# Patient Record
Sex: Male | Born: 1985 | Race: Black or African American | Hispanic: No | Marital: Single | Smoking: Current every day smoker
Health system: Southern US, Community
[De-identification: ages and names within clinical notes are randomized; demographics above are authoritative.]

---
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
## 2014-06-03 ENCOUNTER — Emergency Department (INDEPENDENT_AMBULATORY_CARE_PROVIDER_SITE_OTHER): Payer: BC Managed Care – PPO

## 2014-06-03 ENCOUNTER — Emergency Department (INDEPENDENT_AMBULATORY_CARE_PROVIDER_SITE_OTHER)
Admission: EM | Admit: 2014-06-03 | Discharge: 2014-06-03 | Disposition: A | Discharge status: Home / Self Care | Payer: BC Managed Care – PPO | Source: Home / Self Care | Attending: Family Medicine | Admitting: Family Medicine

## 2014-06-03 ENCOUNTER — Encounter (HOSPITAL_COMMUNITY): Payer: Self-pay | Admitting: Emergency Medicine

## 2014-06-03 DIAGNOSIS — M545 Low back pain, unspecified: Secondary | ICD-10-CM

## 2014-06-03 MED ORDER — IBUPROFEN 800 MG PO TABS
ORAL_TABLET | ORAL | Status: AC
  Filled 2014-06-03: qty 1

## 2014-06-03 MED ORDER — IBUPROFEN 800 MG PO TABS
800.0000 mg | ORAL_TABLET | Freq: Once | ORAL | Status: AC
  Administered 2014-06-03: 800 mg via ORAL

## 2014-06-03 MED ORDER — INDOMETHACIN 25 MG PO CAPS: 25.0000 mg | ORAL_CAPSULE | Freq: Three times a day (TID) | ORAL | Status: DC

## 2014-06-03 NOTE — ED Notes (Signed)
Low back pain and neck pain.  Onset 3 weeks ago, no known injury

## 2014-06-03 NOTE — ED Provider Notes (Signed)
CSN: [id number]     Arrival date & time 06/03/14  1713 History   First MD Initiated Contact with Patient 06/03/14 1754     Chief Complaint  Patient presents with  . Back Pain  . Neck Pain   (Consider location/radiation/quality/duration/timing/severity/associated sxs/prior Treatment) HPI Comments: No specific injury or surgery in past No previous episodes No meds taken at home Pain most noticeable when bending over or jumping or upon getting out bed in the mornings No changes in strength, sensation or coordination of bilateral lower extremities. No fevers or malaise. 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 male presenting with back pain and neck pain. The history is provided by the patient.  Back Pain Location:  Lumbar spine Quality:  Aching Radiates to:  Does not radiate Pain severity:  Moderate Worse during: Symptoms are most notable in the morning upon getting out of bed. Onset quality:  Gradual Duration:  3 weeks Timing:  Constant Progression:  Unchanged Chronicity:  New Context: not falling, not jumping from heights, not lifting heavy objects, not occupational injury, not pedestrian accident, not physical stress, not recent illness, not recent injury and not twisting   Neck Pain   History reviewed. No pertinent past medical history. History reviewed. No pertinent past surgical history. No family history on file. History  Substance Use Topics  . Smoking status: Current Every Day Smoker  . Smokeless tobacco: Not on file  . Alcohol Use: Yes    Review of Systems  Musculoskeletal: Positive for back pain and neck pain.  All other systems reviewed and are negative.   Allergies  Review of patient's allergies indicates no known allergies.  Home Medications   Prior to Admission medications   Medication Sig Start  Date End Date Taking? Authorizing Provider  indomethacin (INDOCIN) 25 MG capsule Take 1 capsule (25 mg total) by mouth 3 (three) times daily with meals. X 5 days 06/03/14   Jess BartersJennifer Lee Imer Foxworth, PA   BP 118/70  Pulse 79  Temp(Src) 98.6 F (37 C) (Oral)  Resp 16 Physical Exam  Nursing note and vitals reviewed. Constitutional: He is oriented to person, place, and time. He appears well-developed and well-nourished. No distress.  HENT:  Head: Normocephalic and atraumatic.  Eyes: Conjunctivae are normal. No scleral icterus.  Neck: Normal range of motion. Neck supple.  Cardiovascular: Normal rate, regular rhythm and normal heart sounds.   Pulmonary/Chest: Effort normal and breath sounds normal. No respiratory distress. He has no wheezes.  Abdominal: Soft. Bowel sounds are normal. He exhibits no distension. There is no tenderness.  Genitourinary:  Declines exam but denies saddle anesthesia   Musculoskeletal: Normal range of motion. He exhibits no edema.       Lumbar back: He exhibits tenderness, bony tenderness and pain. He exhibits normal range of motion, no swelling, no edema, no deformity, no laceration, no spasm and normal pulse.       Back:  Outlined area is area of tenderness  Neurological: He is alert and oriented to person, place, and time. He has normal strength. No sensory deficit. Coordination and gait normal.  Reflex Scores:      Patellar reflexes are 2+ on the right side and 2+ on the left side. CSM exam of bilateral lower extremities without deficit.   Skin: Skin is warm and dry. No rash noted. No erythema.  Psychiatric: He has a normal mood and affect. His behavior is normal.    ED Course  Procedures (including critical care time) Labs Review Labs Reviewed - No data to display  Imaging Review Dg Lumbar Spine Complete  06/03/2014   CLINICAL DATA:  Low back pain  EXAM: LUMBAR SPINE - COMPLETE 4+ VIEW  COMPARISON:  None.  FINDINGS: Normal alignment of lumbar vertebral  bodies. No loss of vertebral body height or disc height. No pars fracture. No subluxation.  IMPRESSION: Normal lumbar radiographs.   Electronically Signed   By: Genevive BiStewart  Edmunds M.D.   On: 06/03/2014 18:46     MDM   1. Midline low back pain without sciatica   LS spine films normal. Exam consistent with non-specific lumbago and without evidence of acute neurological deficit or cauda equina syndrome. Afebrile. Will treat with 5 day course of indocin [facility] if [facility] improvement.    Jess BartersJennifer Lee [place], [place] 06/03/14 1919

## 2014-06-03 NOTE — ED Provider Notes (Signed)
Medical screening examination/treatment/procedure(s) were performed by resident physician or non-physician practitioner and as supervising physician I was immediately available for consultation/collaboration.   Barkley BrunsKINDL,Itzamar Traynor DOUGLAS MD.   Linna HoffJames D Lossie Kalp, MD 06/03/14 [phone]

## 2014-06-03 NOTE — ED Notes (Signed)
Looked for visitor in lobby, unable to locate.  Patient notified.

## 2014-06-03 NOTE — Discharge Instructions (Signed)
Back Exercises Back exercises help treat and prevent back injuries. The goal of back exercises is to increase the strength of your abdominal and back muscles and the flexibility of your back. These exercises should be started when you no longer have back pain. Back exercises include:  Pelvic Tilt. Lie on your back with your knees bent. Tilt your pelvis until the lower part of your back is against the floor. Hold this position 5 to 10 sec and repeat 5 to 10 times.  Knee to Chest. Pull first 1 knee up against your chest and hold for 20 to 30 seconds, repeat this with the other knee, and then both knees. This may be done with the other leg straight or bent, whichever feels better.  Sit-Ups or Curl-Ups. Bend your knees 90 degrees. Start with tilting your pelvis, and do a partial, slow sit-up, lifting your trunk only 30 to 45 degrees off the floor. Take at least 2 to 3 seconds for each sit-up. Do not do sit-ups with your knees out straight. If partial sit-ups are difficult, simply do the above but with only tightening your abdominal muscles and holding it as directed.  Hip-Lift. Lie on your back with your knees flexed 90 degrees. Push down with your feet and shoulders as you raise your hips a couple inches off the floor; hold for 10 seconds, repeat 5 to 10 times.  Back arches. Lie on your stomach, propping yourself up on bent elbows. Slowly press on your hands, causing an arch in your low back. Repeat 3 to 5 times. Any initial stiffness and discomfort should lessen with repetition over time.  Shoulder-Lifts. Lie face down with arms beside your body. Keep hips and torso pressed to floor as you slowly lift your head and shoulders off the floor. Do not overdo your exercises, especially in the beginning. Exercises may cause you some mild back discomfort which lasts for a few minutes; however, if the pain is more severe, or lasts for more than 15 minutes, do not continue exercises until you see your caregiver.  Improvement with exercise therapy for back problems is slow.  See your caregivers for assistance with developing a proper back exercise program. Document Released: 12/21/2004 Document Revised: 02/05/2012 Document Reviewed: 09/14/2011 [facility] Patient Information 2015 [place], [place]. This information is not intended to replace advice given to you by your health care provider. Make sure you discuss any questions you have with your health care provider.  Back Injury Prevention Back injuries can be extremely painful and difficult to heal. After having one back injury, you are much more likely to experience another later on. It is important to learn how to avoid injuring or re-injuring your back. The following tips can help you to prevent a back injury. PHYSICAL FITNESS  Exercise regularly and try to develop good tone in your abdominal muscles. Your abdominal muscles provide a lot of the support needed by your back.  Do aerobic exercises (walking, jogging, biking, swimming) regularly.  Do exercises that increase balance and strength (tai chi, yoga) regularly. This can decrease your risk of falling and injuring your back.  Stretch before and after exercising.  Maintain a healthy weight. The more you weigh, the more stress is placed on your back. For every pound of weight, 10 times that amount of pressure is placed on the back. DIET  Talk to your caregiver about how much calcium and vitamin D you need per day. These nutrients help to prevent weakening of the bones (osteoporosis). Osteoporosis  can cause broken (fractured) bones that lead to back pain. °· Include good sources of calcium in your diet, such as dairy products, green, leafy vegetables, and products with calcium added (fortified). °· Include good sources of vitamin D in your diet, such as milk and foods that are fortified with vitamin D. °· Consider taking a nutritional supplement or a multivitamin if needed. °· Stop smoking if you  smoke. °POSTURE °· Sit and stand up straight. Avoid leaning forward when you sit or hunching over when you stand. °· Choose chairs with good low back (lumbar) support. °· If you work at a desk, sit close to your work so you do not need to lean over. Keep your chin tucked in. Keep your neck drawn back and elbows bent at a right angle. Your arms should look like the letter "L." °· Sit high and close to the steering wheel when you drive. Add a lumbar support to your car seat if needed. °· Avoid sitting or standing in one position for too long. Take breaks to get up, stretch, and walk around at least once every hour. Take breaks if you are driving for long periods of time. °· Sleep on your side with your knees slightly bent, or sleep on your back with a pillow under your knees. Do not sleep on your stomach. °LIFTING, TWISTING, AND REACHING °· Avoid heavy lifting, especially repetitive lifting. If you must do heavy lifting: °¨ Stretch before lifting. °¨ Work slowly. °¨ Rest between lifts. °¨ Use carts and dollies to move objects when possible. °¨ Make several small trips instead of carrying 1 heavy load. °¨ Ask for help when you need it. °¨ Ask for help when moving big, awkward objects. °· Follow these steps when lifting: °¨ Stand with your feet shoulder-width apart. °¨ Get as close to the object as you can. Do not try to pick up heavy objects that are far from your body. °¨ Use handles or lifting straps if they are available. °¨ Bend at your knees. Squat down, but keep your heels off the floor. °¨ Keep your shoulders pulled back, your chin tucked in, and your back straight. °¨ Lift the object slowly, tightening the muscles in your legs, abdomen, and buttocks. Keep the object as close to the center of your body as possible. °¨ When you put a load down, use these same guidelines in reverse. °· Do not: °¨ Lift the object above your waist. °¨ Twist at the waist while lifting or carrying a load. Move your feet if you need to  turn, not your waist. °¨ Bend over without bending at your knees. °· Avoid reaching over your head, across a table, or for an object on a high surface. °OTHER TIPS °· Avoid wet floors and keep sidewalks clear of ice to prevent falls. °· Do not sleep on a mattress that is too soft or too hard. °· Keep items that are used frequently within easy reach. °· Put heavier objects on shelves at waist level and lighter objects on lower or higher shelves. °· Find ways to decrease your stress, such as exercise, massage, or relaxation techniques. Stress can build up in your muscles. Tense muscles are more vulnerable to injury. °· Seek treatment for depression or anxiety if needed. These conditions can increase your risk of developing back pain. °SEEK MEDICAL CARE IF: °· You injure your back. °· You have questions about diet, exercise, or other ways to prevent back injuries. °MAKE SURE YOU: °· Understand these   instructions. °· Will watch your condition. °· Will get help right away if you are not doing well or get worse. °Document Released: 12/21/2004 Document Revised: 02/05/2012 Document Reviewed: 12/25/2011 °ExitCare® Patient Information ©2015 [employer]. This information is not intended to replace advice given to you by your health care provider. Make sure you discuss any questions you have with your health care provider. ° °Back Pain, Adult °Low back pain is very common. About 1 in 5 people have back pain. The cause of low back pain is rarely dangerous. The pain often gets better over time. About half of people with a sudden onset of back pain feel better in just 2 weeks. About 8 in 10 people feel better by 6 weeks.  °CAUSES °Some common causes of back pain include: °· Strain of the muscles or ligaments supporting the spine. °· Wear and tear (degeneration) of the spinal discs. °· Arthritis. °· Direct injury to the back. °DIAGNOSIS °Most of the time, the direct cause of low back pain is not known. However, back pain can be  treated effectively even when the exact cause of the pain is unknown. Answering your caregiver's questions about your overall health and symptoms is one of the most accurate ways to make sure the cause of your pain is not dangerous. If your caregiver needs more information, he or she may order lab work or imaging tests (X-rays or MRIs). However, even if imaging tests show changes in your back, this usually does not require surgery. °HOME CARE INSTRUCTIONS °For many people, back pain returns. Since low back pain is rarely dangerous, it is often a condition that people can learn to manage on their own.  °· Remain active. It is stressful on the back to sit or stand in one place. Do not sit, drive, or stand in one place for more than 30 minutes at a time. Take short walks on level surfaces as soon as pain allows. Try to increase the length of time you walk each day. °· Do not stay in bed. Resting more than 1 or 2 days can delay your recovery. °· Do not avoid exercise or work. Your body is made to move. It is not dangerous to be active, even though your back may hurt. Your back will likely heal faster if you return to being active before your pain is gone. °· Pay attention to your body when you  bend and lift. Many people have less discomfort when lifting if they bend their knees, keep the load close to their bodies, and avoid twisting. Often, the most comfortable positions are those that put less stress on your recovering back. °· Find a comfortable position to sleep. Use a firm mattress and lie on your side with your knees slightly bent. If you lie on your back, put a pillow under your knees. °· Only take over-the-counter or prescription medicines as directed by your caregiver. Over-the-counter medicines to reduce pain and inflammation are often the most helpful. Your caregiver may prescribe muscle relaxant drugs. These medicines help dull your pain so you can more quickly return to your normal activities and healthy  exercise. °· Put ice on the injured area. °¨ Put ice in a plastic bag. °¨ Place a towel between your skin and the bag. °¨ Leave the ice on for 15-20 minutes, 03-04 times a day for the first 2 to 3 days. After that, ice and heat may be alternated to reduce pain and spasms. °· Ask your caregiver about trying back exercises and gentle massage. This may be of some benefit. °· Avoid feeling anxious or stressed. Stress increases   muscle tension and can worsen back pain.It is important to recognize when you are anxious or stressed and learn ways to manage it.Exercise is a great option. SEEK MEDICAL CARE IF:  You have pain that is not relieved with rest or medicine.  You have pain that does not improve in 1 week.  You have new symptoms.  You are generally not feeling well. SEEK IMMEDIATE MEDICAL CARE IF:   You have pain that radiates from your back into your legs.  You develop new bowel or bladder control problems.  You have unusual weakness or numbness in your arms or legs.  You develop nausea or vomiting.  You develop abdominal pain.  You feel faint. Document Released: 11/13/2005 Document Revised: 05/14/2012 Document Reviewed: 04/03/2011 [facility] Patient Information 2015 [place], [place]. This information is not intended to replace advice given to you by your health care provider. Make sure you discuss any questions you have with your health care provider.

## 2014-12-01 ENCOUNTER — Encounter (HOSPITAL_COMMUNITY): Payer: Self-pay | Admitting: Emergency Medicine

## 2014-12-01 ENCOUNTER — Emergency Department (INDEPENDENT_AMBULATORY_CARE_PROVIDER_SITE_OTHER)
Admission: EM | Admit: 2014-12-01 | Discharge: 2014-12-01 | Disposition: A | Discharge status: Home / Self Care | Payer: Self-pay | Source: Home / Self Care | Attending: Emergency Medicine | Admitting: Emergency Medicine

## 2014-12-01 DIAGNOSIS — J069 Acute upper respiratory infection, unspecified: Secondary | ICD-10-CM

## 2014-12-01 NOTE — ED Notes (Signed)
C/o  Productive cough and fever.  Fatigue.  Symptoms present since 12/31.   No relief with otc meds.  Denies vomiting and diarrhea.

## 2014-12-01 NOTE — Discharge Instructions (Signed)
Upper Respiratory Infection, Adult °An upper respiratory infection (URI) is also known as the common cold. It is often caused by a type of germ (virus). Colds are easily spread (contagious). You can pass it to others by kissing, coughing, sneezing, or drinking out of the same glass. Usually, you get better in 1 or 2 weeks.  °HOME CARE  °· Only take medicine as told by your doctor. °· Use a warm mist humidifier or breathe in steam from a hot shower. °· Drink enough water and fluids to keep your pee (urine) clear or pale yellow. °· Get plenty of rest. °· Return to work when your temperature is back to normal or as told by your doctor. You may use a face mask and wash your hands to stop your cold from spreading. °GET HELP RIGHT AWAY IF:  °· After the first few days, you feel you are getting worse. °· You have questions about your medicine. °· You have chills, shortness of breath, or brown or red spit (mucus). °· You have yellow or brown snot (nasal discharge) or pain in the face, especially when you bend forward. °· You have a fever, puffy (swollen) neck, pain when you swallow, or white spots in the back of your throat. °· You have a bad headache, ear pain, sinus pain, or chest pain. °· You have a high-pitched whistling sound when you breathe in and out (wheezing). °· You have a lasting cough or cough up blood. °· You have sore muscles or a stiff neck. °MAKE SURE YOU:  °· Understand these instructions. °· Will watch your condition. °· Will get help right away if you are not doing well or get worse. °Document Released: 05/01/2008 Document Revised: 02/05/2012 Document Reviewed: 02/18/2014 °ExitCare® Patient Information ©2015 [employer]. This information is not intended to replace advice given to you by your health care provider. Make sure you discuss any questions you have with your health care provider. ° °Viral Infections °A virus is a type of germ. Viruses can cause: °· Minor sore throats. °· Aches and  pains. °· Headaches. °· Runny nose. °· Rashes. °· Watery eyes. °· Tiredness. °· Coughs. °· Loss of appetite. °· Feeling sick to your stomach (nausea). °· Throwing up (vomiting). °· Watery poop (diarrhea). °HOME CARE  °· Only take medicines as told by your doctor. °· Drink enough water and fluids to keep your pee (urine) clear or pale yellow. Sports drinks are a good choice. °· Get plenty of rest and eat healthy. Soups and broths with crackers or rice are fine. °GET HELP RIGHT AWAY IF:  °· You have a very bad headache. °· You have shortness of breath. °· You have chest pain or neck pain. °· You have an unusual rash. °· You cannot stop throwing up. °· You have watery poop that does not stop. °· You cannot keep fluids down. °· You or your child has a temperature by mouth above 102° F (38.9° C), not controlled by medicine. °· Your baby is older than 3 months with a rectal temperature of 102° F (38.9° C) or higher. °· Your baby is 3 months old or younger with a rectal temperature of 100.4° F (38° C) or higher. °MAKE SURE YOU:  °· Understand these instructions. °· Will watch this condition. °· Will get help right away if you are not doing well or get worse. °Document Released: 10/26/2008 Document Revised: 02/05/2012 Document Reviewed: 03/21/2011 °ExitCare® Patient Information ©2015 [employer]. This information is not intended to replace   advice given to you by your health care provider. Make sure you discuss any questions you have with your health care provider. ° °

## 2014-12-01 NOTE — ED Provider Notes (Signed)
CSN: [id number]     Arrival date & time 12/01/14  1830 History   First MD Initiated Contact with Patient 12/01/14 1908     Chief Complaint  Patient presents with  . URI    Patient is a 29 y.o. male presenting with URI. The history is provided by the patient.  URI Presenting symptoms: congestion, cough, fatigue, fever and rhinorrhea   Presenting symptoms: no ear pain, no facial pain and no sore throat   Severity:  Moderate Onset quality:  Gradual Duration:  4 days Timing:  Constant Progression:  Improving Chronicity:  New Relieved by:  Rest, OTC medications and decongestant Associated symptoms: no arthralgias, no headaches, no myalgias, no neck pain, no sinus pain, no sneezing, no swollen glands and no wheezing   Risk factors: not elderly, no chronic respiratory disease, no diabetes mellitus, no immunosuppression, no recent illness, no recent travel and no sick contacts   Pt reports that on Friday he had onset of malaise, sinus congestion, mild cough and fever. He took Ibuprofen and Dayquil for his symptoms which have almost completely resolved. Now he states he just feels tired. Denies fever since Saturday. He works outside and his employer needs a note stating when he can return to work since he missed a couple of days while he was ill.   History reviewed. No pertinent past medical history. History reviewed. No pertinent past surgical history. History reviewed. No pertinent family history. History  Substance Use Topics  . Smoking status: Current Every Day Smoker  . Smokeless tobacco: Not on file  . Alcohol Use: Yes    Review of Systems  Constitutional: Positive for fever and fatigue.  HENT: Positive for congestion and rhinorrhea. Negative for ear pain, sneezing and sore throat.   Eyes: Negative.   Respiratory: Positive for cough. Negative for wheezing.   Cardiovascular: Negative.   Gastrointestinal: Negative.   Endocrine: Negative.   Musculoskeletal: Negative for myalgias,  arthralgias and neck pain.  Skin: Negative.   Allergic/Immunologic: Negative.   Neurological: Negative.  Negative for headaches.  Hematological: Negative.   Psychiatric/Behavioral: Negative.     Allergies  Review of patient's allergies indicates no known allergies.  Home Medications   Prior to Admission medications   Medication Sig Start Date End Date Taking? Authorizing Provider  indomethacin (INDOCIN) 25 MG capsule Take 1 capsule (25 mg total) by mouth 3 (three) times daily with meals. X 5 days 06/03/14   Jess Barters H Presson, PA   BP 115/65 mmHg  Pulse 75  Temp(Src) 98.2 F (36.8 C) (Oral)  Resp 12  SpO2 97% Physical Exam  Constitutional: He appears well-developed and well-nourished.  HENT:  Head: Normocephalic and atraumatic.  Right Ear: Tympanic membrane, external ear and ear canal normal.  Left Ear: Tympanic membrane, external ear and ear canal normal.  Nose: Nose normal. Right sinus exhibits no maxillary sinus tenderness and no frontal sinus tenderness. Left sinus exhibits no maxillary sinus tenderness and no frontal sinus tenderness.  Mouth/Throat: Uvula is midline, oropharynx is clear and moist and mucous membranes are normal.    ED Course  Procedures (including critical care time) Labs Review Labs Reviewed - No data to display  Imaging Review No results found.   MDM   1. URI (upper respiratory infection)    Several days of viral URI type sx's which have resolved. Now w/ just general malaise. Pt encouraged to rest, drink plenty of liquids, continue Ibuprofen as needed. Work Note provided.     Natalia Leatherwood  P Miguel Medal, NP 12/01/14 [id number]

## 2015-03-16 ENCOUNTER — Other Ambulatory Visit (HOSPITAL_COMMUNITY)
Admission: RE | Admit: 2015-03-16 | Discharge: 2015-03-16 | Disposition: A | Discharge status: Home / Self Care | Payer: Self-pay | Source: Ambulatory Visit | Attending: Family Medicine | Admitting: Family Medicine

## 2015-03-16 ENCOUNTER — Emergency Department (INDEPENDENT_AMBULATORY_CARE_PROVIDER_SITE_OTHER)
Admission: EM | Admit: 2015-03-16 | Discharge: 2015-03-16 | Disposition: A | Discharge status: Home / Self Care | Payer: Self-pay | Source: Home / Self Care | Attending: Family Medicine | Admitting: Family Medicine

## 2015-03-16 ENCOUNTER — Encounter (HOSPITAL_COMMUNITY): Payer: Self-pay | Admitting: Emergency Medicine

## 2015-03-16 DIAGNOSIS — Z113 Encounter for screening for infections with a predominantly sexual mode of transmission: Secondary | ICD-10-CM | POA: Insufficient documentation

## 2015-03-16 MED ORDER — CEFTRIAXONE SODIUM 250 MG IJ SOLR
INTRAMUSCULAR | Status: AC
  Filled 2015-03-16: qty 250

## 2015-03-16 MED ORDER — CEFTRIAXONE SODIUM 250 MG IJ SOLR
250.0000 mg | Freq: Once | INTRAMUSCULAR | Status: AC
  Administered 2015-03-16: 250 mg via INTRAMUSCULAR

## 2015-03-16 MED ORDER — AZITHROMYCIN 250 MG PO TABS
1000.0000 mg | ORAL_TABLET | Freq: Once | ORAL | Status: AC
  Administered 2015-03-16: 1000 mg via ORAL

## 2015-03-16 MED ORDER — AZITHROMYCIN 250 MG PO TABS
ORAL_TABLET | ORAL | Status: AC
  Filled 2015-03-16: qty 4

## 2015-03-16 MED ORDER — LIDOCAINE HCL (PF) 1 % IJ SOLN
INTRAMUSCULAR | Status: AC
  Filled 2015-03-16: qty 5

## 2015-03-16 NOTE — ED Provider Notes (Signed)
CSN: [id number]     Arrival date & time 03/16/15  1415 History   First MD Initiated Contact with Patient 03/16/15 1609     Chief Complaint  Patient presents with  . SEXUALLY TRANSMITTED DISEASE   (Consider location/radiation/quality/duration/timing/severity/associated sxs/prior Treatment) Patient is a 29 y.o. male presenting with STD exposure. The history is provided by the patient.  Exposure to STD This is a new problem. The current episode started more than 1 week ago (pt concerned about chlamydia , girl told by her md that she has it from him.). The problem has not changed since onset.Associated symptoms comments: Urethral d/c present for some time. Pt in denial..    History reviewed. No pertinent past medical history. History reviewed. No pertinent past surgical history. History reviewed. No pertinent family history. History  Substance Use Topics  . Smoking status: Current Every Day Smoker  . Smokeless tobacco: Not on file  . Alcohol Use: Yes    Review of Systems  Constitutional: Negative.   Genitourinary: Positive for discharge and penile pain. Negative for penile swelling, scrotal swelling and testicular pain.    Allergies  Review of patient's allergies indicates no known allergies.  Home Medications   Prior to Admission medications   Medication Sig Start Date End Date Taking? Authorizing Provider  indomethacin (INDOCIN) 25 MG capsule Take 1 capsule (25 mg total) by mouth 3 (three) times daily with meals. X 5 days 06/03/14   Jess BartersJennifer Lee H Presson, PA   BP 115/76 mmHg  Pulse 71  Temp(Src) 99 F (37.2 C) (Oral)  Resp 16  SpO2 99% Physical Exam  Constitutional: He is oriented to person, place, and time. He appears well-developed and well-nourished.  Abdominal: Soft. Bowel sounds are normal. He exhibits no distension. There is no tenderness.  Genitourinary: Testes normal and penis normal. Cremasteric reflex is present. Circumcised. No penile tenderness. No discharge  found.  Lymphadenopathy:       Right: No inguinal adenopathy present.       Left: No inguinal adenopathy present.  Neurological: He is alert and oriented to person, place, and time.  Skin: Skin is warm and dry.  Nursing note and vitals reviewed.   ED Course  Procedures (including critical care time) Labs Review Labs Reviewed  HIV ANTIBODY (ROUTINE TESTING)  RPR  URINE CYTOLOGY ANCILLARY ONLY  CYTOLOGY, (ORAL, ANAL, URETHRAL) ANCILLARY ONLY    Imaging Review No results found.   MDM   1. Screen for STD (sexually transmitted disease)        Linna HoffJames D Kindl, MD 03/16/15 [phone]

## 2015-03-16 NOTE — ED Notes (Signed)
Pt is concerned he may have an STD after having intercourse with a girl 3 weeks ago and having some burning with urination the next day.  Pt denies this symptom at this time and also denies any odor, d/c, pain with urination, frequency of urination or fever.  Pt has been told we do not routinely screen for STD's unless the pt has symptoms.  Pt is certain he has chlamydia, but his partner does not have it as far as he knows.

## 2015-03-16 NOTE — Discharge Instructions (Signed)
We will call with positive test results and treat as indicated  °

## 2015-03-17 LAB — URINE CYTOLOGY ANCILLARY ONLY
Chlamydia: POSITIVE — AB
NEISSERIA GONORRHEA: NEGATIVE
Trichomonas: NEGATIVE

## 2015-03-17 LAB — CYTOLOGY, (ORAL, ANAL, URETHRAL) ANCILLARY ONLY
Chlamydia: POSITIVE — AB
NEISSERIA GONORRHEA: NEGATIVE

## 2015-03-17 LAB — HIV ANTIBODY (ROUTINE TESTING W REFLEX): HIV Screen 4th Generation wRfx: NONREACTIVE

## 2015-03-17 LAB — RPR: RPR Ser Ql: NONREACTIVE

## 2015-03-17 NOTE — ED Notes (Signed)
RPR and HIV screening results are negative. Waiting for chlamydia, GC reports. Has already been treated for both

## 2015-03-22 ENCOUNTER — Telehealth (HOSPITAL_COMMUNITY): Payer: Self-pay | Admitting: *Deleted

## 2015-03-22 NOTE — ED Notes (Addendum)
GC and Trich neg., Chlamydia pos.  Pt. adequately treated with Zithromax and also got Rocephin.  I called pt. but his number is not in service. I called contact's number and left a message to call. Call 1. DHHS [facility]. Vassie MoselleYork, Chayanne Speir M 03/22/2015 I called contact- mother.  She asked if it was urgent and I said no but it is important.  She said he turned his phone off but she would give him the message.  Call 2. 03/24/2015 Left message.  Call 3. 03/30/2015 Unable to reach pt. by phone x 3.  Confidential marked letter sent with result and instructions. 03/31/2015

## 2015-12-07 IMAGING — CR DG LUMBAR SPINE COMPLETE 4+V
5 series · 5 of 5 positions shown · non-contrast
Comparison: None.

CLINICAL DATA: Low back pain

EXAM:
LUMBAR SPINE - COMPLETE 4+ VIEW

[view not recorded (1 of 5)]
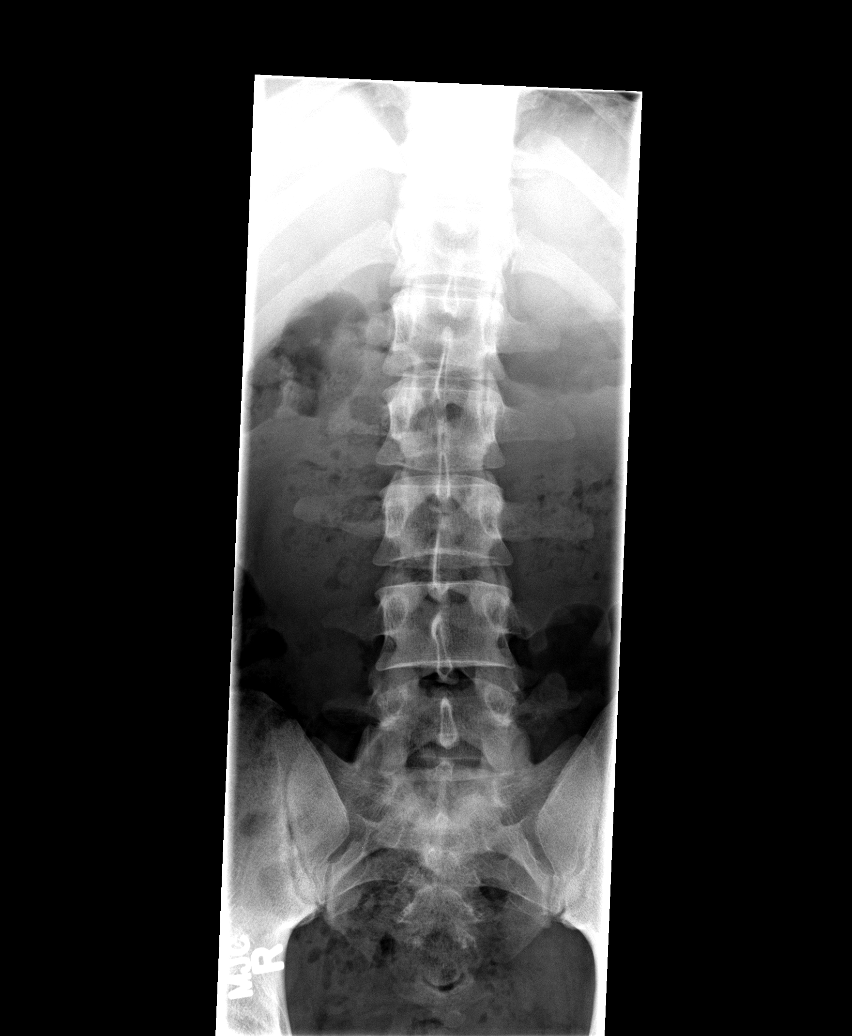

[view not recorded (2 of 5)]
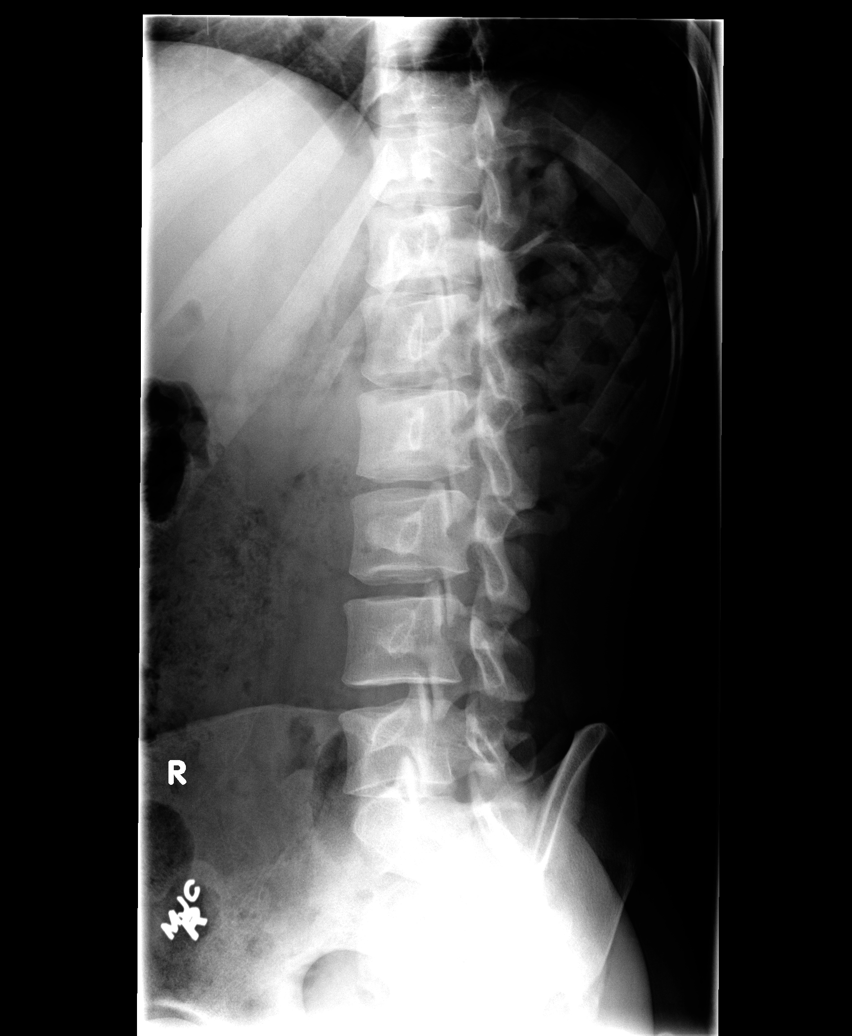

[view not recorded (3 of 5)]
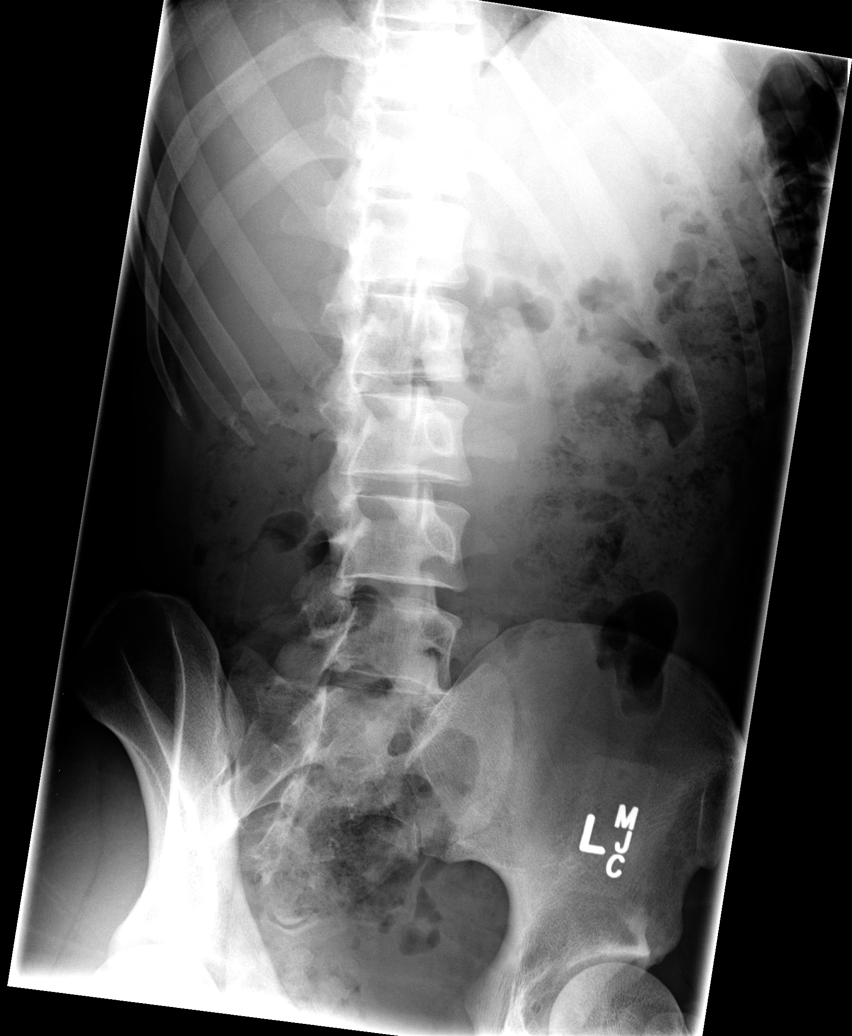

[view not recorded (4 of 5)]
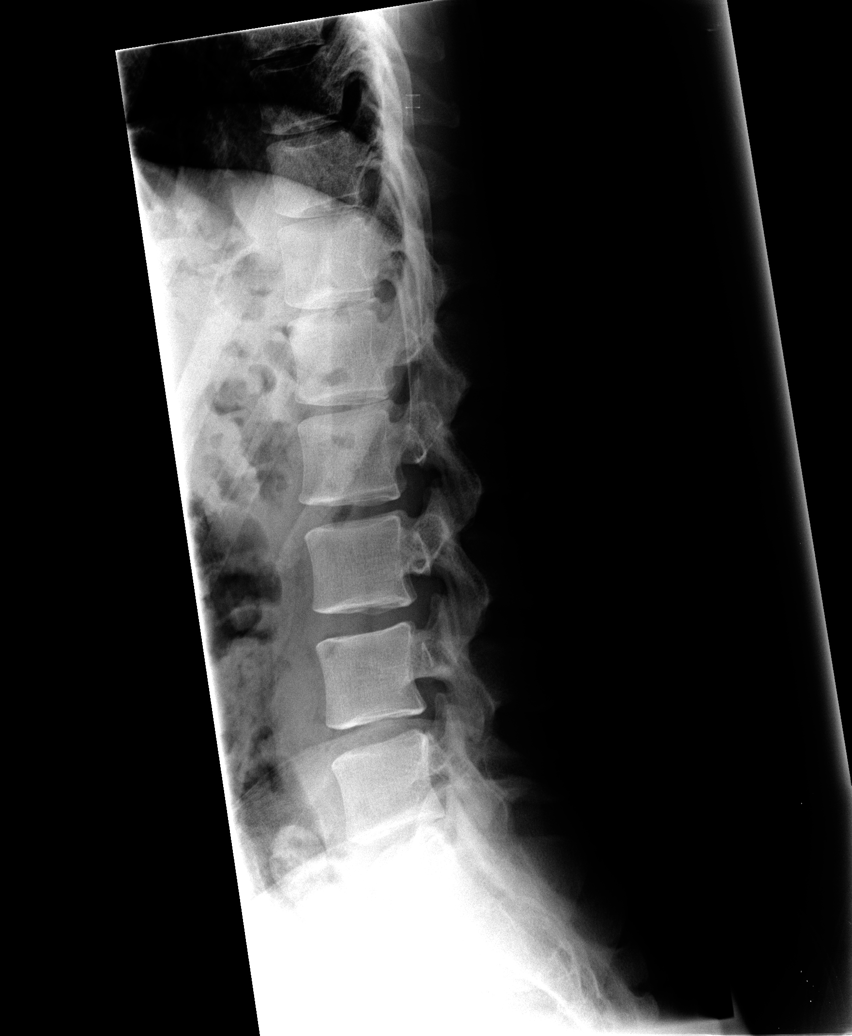

[view not recorded (5 of 5)]
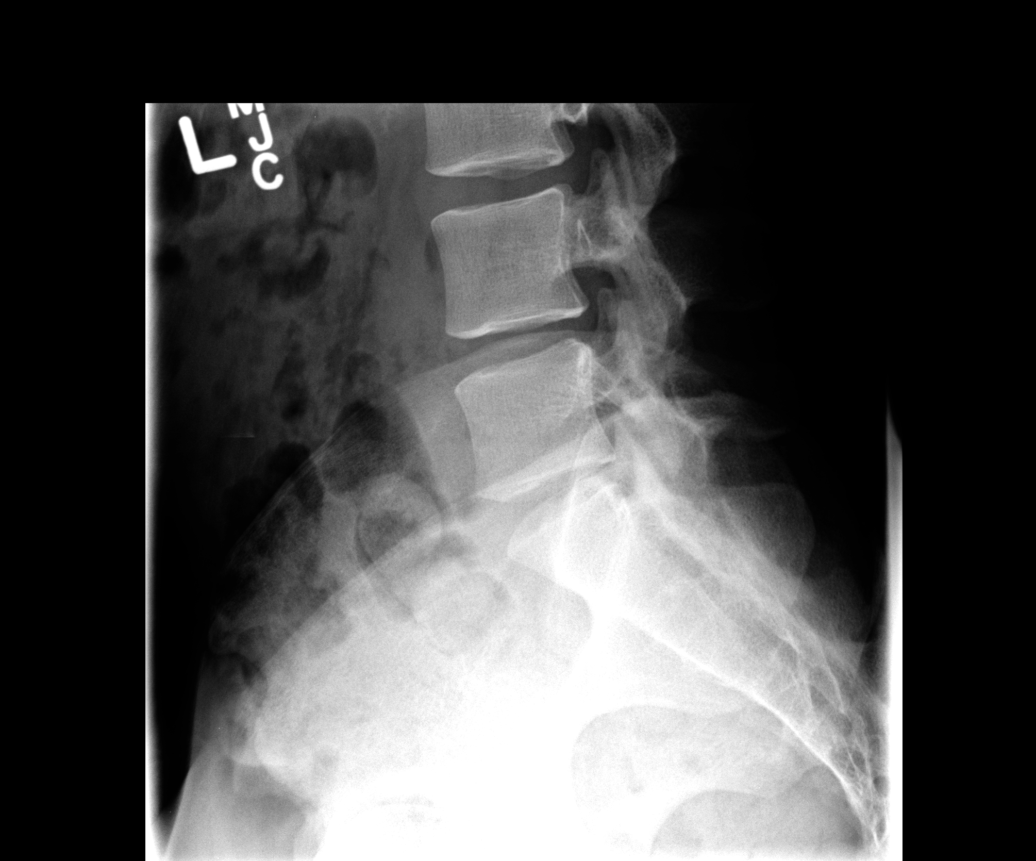

[5 of 5 positions shown; findings below may reference images not displayed]

FINDINGS: Normal alignment of lumbar vertebral bodies. No loss of vertebral
body height or disc height. No pars fracture. No subluxation.
IMPRESSION: Normal lumbar radiographs.

## 2017-02-19 ENCOUNTER — Ambulatory Visit (HOSPITAL_COMMUNITY)
Admission: EM | Admit: 2017-02-19 | Discharge: 2017-02-19 | Disposition: A | Discharge status: Home / Self Care | Payer: Self-pay | Attending: Internal Medicine | Admitting: Internal Medicine

## 2017-02-19 ENCOUNTER — Encounter (HOSPITAL_COMMUNITY): Payer: Self-pay | Admitting: Emergency Medicine

## 2017-02-19 DIAGNOSIS — Z79899 Other long term (current) drug therapy: Secondary | ICD-10-CM | POA: Insufficient documentation

## 2017-02-19 DIAGNOSIS — F17200 Nicotine dependence, unspecified, uncomplicated: Secondary | ICD-10-CM | POA: Insufficient documentation

## 2017-02-19 DIAGNOSIS — J029 Acute pharyngitis, unspecified: Secondary | ICD-10-CM

## 2017-02-19 DIAGNOSIS — J039 Acute tonsillitis, unspecified: Secondary | ICD-10-CM | POA: Insufficient documentation

## 2017-02-19 LAB — POCT INFECTIOUS MONO SCREEN: Mono Screen: NEGATIVE

## 2017-02-19 LAB — POCT RAPID STREP A: Streptococcus, Group A Screen (Direct): NEGATIVE

## 2017-02-19 MED ORDER — AMOXICILLIN-POT CLAVULANATE 875-125 MG PO TABS: 1.0000 | ORAL_TABLET | Freq: Two times a day (BID) | ORAL | 0 refills | Status: DC

## 2017-02-19 MED ORDER — ACETAMINOPHEN 325 MG PO TABS
ORAL_TABLET | ORAL | Status: AC
  Filled 2017-02-19: qty 1

## 2017-02-19 MED ORDER — ACETAMINOPHEN 325 MG PO TABS
650.0000 mg | ORAL_TABLET | Freq: Once | ORAL | Status: AC
  Administered 2017-02-19: 650 mg via ORAL

## 2017-02-19 MED ORDER — ACETAMINOPHEN 325 MG PO TABS
ORAL_TABLET | ORAL | Status: AC
  Filled 2017-02-19: qty 2

## 2017-02-19 MED ORDER — DEXAMETHASONE SODIUM PHOSPHATE 10 MG/ML IJ SOLN
INTRAMUSCULAR | Status: AC
  Filled 2017-02-19: qty 1

## 2017-02-19 MED ORDER — DEXAMETHASONE SODIUM PHOSPHATE 10 MG/ML IJ SOLN
10.0000 mg | Freq: Once | INTRAMUSCULAR | Status: AC
  Administered 2017-02-19: 10 mg via INTRAMUSCULAR

## 2017-02-19 NOTE — ED Triage Notes (Signed)
Pt has been suffering from a sore throat for four days.  He also reports having chills and no appetite.

## 2017-02-19 NOTE — Discharge Instructions (Signed)
Saline gargles twice daily. Tylenol/Motrin as needed for pain and fever.

## 2017-02-19 NOTE — ED Provider Notes (Signed)
CSN: [id number]     Arrival date & time 02/19/17  1718 History   First MD Initiated Contact with Patient 02/19/17 1903     Chief Complaint  Patient presents with  . Sore Throat   (Consider location/radiation/quality/duration/timing/severity/associated sxs/prior Treatment) The history is provided by the patient.  Sore Throat  The current episode started more than 2 days ago. The problem has been gradually worsening. The symptoms are aggravated by swallowing. Nothing relieves the symptoms. He has tried acetaminophen for the symptoms. The treatment provided no relief.    History reviewed. No pertinent past medical history. History reviewed. No pertinent surgical history. History reviewed. No pertinent family history. Social History  Substance Use Topics  . Smoking status: Current Every Day Smoker  . Smokeless tobacco: Never Used  . Alcohol use Yes    Review of Systems  Constitutional: Positive for appetite change, chills and fever.  HENT: Positive for sore throat and trouble swallowing.     Allergies  Patient has no known allergies.  Home Medications   Prior to Admission medications   Medication Sig Start Date End Date Taking? Authorizing Provider  amoxicillin-clavulanate (AUGMENTIN) 875-125 MG tablet Take 1 tablet by mouth 2 (two) times daily. 02/19/17   Nils Thor, NP  indomethacin (INDOCIN) 25 MG capsule Take 1 capsule (25 mg total) by mouth 3 (three) times daily with meals. X 5 days 06/03/14   Ria ClockJennifer Lee H Presson, PA   Meds Ordered and Administered this Visit   Medications  acetaminophen (TYLENOL) tablet 650 mg (650 mg Oral Given 02/19/17 1809)  dexamethasone (DECADRON) injection 10 mg (10 mg Intramuscular Given 02/19/17 1916)    BP 118/79 (BP Location: Right Arm)   Pulse 96   Temp (!) 103 F (39.4 C) (Oral)  No data found.   Physical Exam  Constitutional: He appears well-developed and well-nourished. No distress.  HENT:  Head: Normocephalic.   Mouth/Throat: Uvula is midline. Oropharyngeal exudate (B/L Inflammed and enlarged tonsillar glands with exudate B/L ), posterior oropharyngeal edema and posterior oropharyngeal erythema present. No tonsillar abscesses.  Eyes: Pupils are equal, round, and reactive to light.  Cardiovascular: Normal rate, regular rhythm and normal heart sounds.   Pulmonary/Chest: Effort normal and breath sounds normal.  Skin: Skin is warm.    Urgent Care Course     Procedures (including critical care time)  Labs Review Labs Reviewed  POCT RAPID STREP A  POCT INFECTIOUS MONO SCREEN    Imaging Review No results found.   Visual Acuity Review  Right Eye Distance:   Left Eye Distance:   Bilateral Distance:    Right Eye Near:   Left Eye Near:    Bilateral Near:         MDM   1. Tonsillitis   Rapid Strep and Mono Negative in house. Highly likely Strep Tonsillitis. Tx initiated based on clinical presentation and physical exam. Throat culture sent, results pending. Pt agrees with plan of care.     Kimmora Risenhoover, NP 02/19/17 1942

## 2017-02-21 LAB — CULTURE, GROUP A STREP (THRC)

## 2020-05-30 ENCOUNTER — Emergency Department (HOSPITAL_COMMUNITY)
Admission: EM | Admit: 2020-05-30 | Discharge: 2020-05-31 | Disposition: A | Discharge status: Home / Self Care | Payer: Self-pay | Attending: Emergency Medicine | Admitting: Emergency Medicine

## 2020-05-30 DIAGNOSIS — F17200 Nicotine dependence, unspecified, uncomplicated: Secondary | ICD-10-CM | POA: Insufficient documentation

## 2020-05-30 DIAGNOSIS — F1123 Opioid dependence with withdrawal: Secondary | ICD-10-CM | POA: Insufficient documentation

## 2020-05-30 DIAGNOSIS — F1193 Opioid use, unspecified with withdrawal: Secondary | ICD-10-CM

## 2020-05-30 LAB — CBC WITH DIFFERENTIAL/PLATELET
Abs Immature Granulocytes: 0.03 10*3/uL (ref 0.00–0.07)
Basophils Absolute: 0 10*3/uL (ref 0.0–0.1)
Basophils Relative: 0 %
Eosinophils Absolute: 0 10*3/uL (ref 0.0–0.5)
Eosinophils Relative: 0 %
HCT: 41.3 % (ref 39.0–52.0)
Hemoglobin: 14 g/dL (ref 13.0–17.0)
Immature Granulocytes: 0 %
Lymphocytes Relative: 17 %
Lymphs Abs: 1.7 10*3/uL (ref 0.7–4.0)
MCH: 29.3 pg (ref 26.0–34.0)
MCHC: 33.9 g/dL (ref 30.0–36.0)
MCV: 86.4 fL (ref 80.0–100.0)
Monocytes Absolute: 0.5 10*3/uL (ref 0.1–1.0)
Monocytes Relative: 5 %
Neutro Abs: 8.1 10*3/uL — ABNORMAL HIGH (ref 1.7–7.7)
Neutrophils Relative %: 78 %
Platelets: 313 10*3/uL (ref 150–400)
RBC: 4.78 MIL/uL (ref 4.22–5.81)
RDW: 12.1 % (ref 11.5–15.5)
WBC: 10.4 10*3/uL (ref 4.0–10.5)
nRBC: 0 % (ref 0.0–0.2)

## 2020-05-30 LAB — COMPREHENSIVE METABOLIC PANEL
ALT: 20 U/L (ref 0–44)
AST: 22 U/L (ref 15–41)
Albumin: 4.4 g/dL (ref 3.5–5.0)
Alkaline Phosphatase: 79 U/L (ref 38–126)
Anion gap: 11 (ref 5–15)
BUN: 9 mg/dL (ref 6–20)
CO2: 25 mmol/L (ref 22–32)
Calcium: 9.7 mg/dL (ref 8.9–10.3)
Chloride: 103 mmol/L (ref 98–111)
Creatinine, Ser: 0.91 mg/dL (ref 0.61–1.24)
GFR calc Af Amer: >60 mL/min (ref >60)
GFR calc non Af Amer: >60 mL/min (ref >60)
Glucose, Bld: 89 mg/dL (ref 70–99)
Potassium: 3.8 mmol/L (ref 3.5–5.1)
Sodium: 139 mmol/L (ref 135–145)
Total Bilirubin: 1.1 mg/dL (ref 0.3–1.2)
Total Protein: 7.3 g/dL (ref 6.5–8.1)

## 2020-05-30 LAB — ACETAMINOPHEN LEVEL: Acetaminophen (Tylenol), Serum: <10 ug/mL — ABNORMAL LOW (ref 10–30)

## 2020-05-30 LAB — ETHANOL: Alcohol, Ethyl (B): <10 mg/dL (ref <10)

## 2020-05-30 LAB — SALICYLATE LEVEL: Salicylate Lvl: <7 mg/dL — ABNORMAL LOW (ref 7.0–30.0)

## 2020-05-30 MED ORDER — ONDANSETRON 4 MG PO TBDP
4.0000 mg | ORAL_TABLET | Freq: Three times a day (TID) | ORAL | 0 refills | Status: AC | PRN
Start: 2020-05-30 — End: ?

## 2020-05-30 MED ORDER — LORAZEPAM 1 MG PO TABS
2.0000 mg | ORAL_TABLET | Freq: Once | ORAL | Status: AC
  Administered 2020-05-30: 2 mg via ORAL
  Filled 2020-05-30: qty 2

## 2020-05-30 MED ORDER — PROMETHAZINE HCL 25 MG PO TABS
25.0000 mg | ORAL_TABLET | Freq: Once | ORAL | Status: AC
  Administered 2020-05-30: 25 mg via ORAL
  Filled 2020-05-30: qty 1

## 2020-05-30 MED ORDER — SODIUM CHLORIDE 0.9 % IV BOLUS
1000.0000 mL | Freq: Once | INTRAVENOUS | Status: AC
  Administered 2020-05-30: 1000 mL via INTRAVENOUS

## 2020-05-30 MED ORDER — ACETAMINOPHEN 325 MG PO TABS
650.0000 mg | ORAL_TABLET | Freq: Once | ORAL | Status: AC
  Administered 2020-05-30: 650 mg via ORAL
  Filled 2020-05-30: qty 2

## 2020-05-30 MED ORDER — ACETAMINOPHEN ER 650 MG PO TBCR: 650.0000 mg | EXTENDED_RELEASE_TABLET | Freq: Three times a day (TID) | ORAL | 0 refills | Status: AC | PRN

## 2020-05-30 MED ORDER — CLONIDINE HCL 0.1 MG PO TABS
0.1000 mg | ORAL_TABLET | Freq: Once | ORAL | Status: AC
  Administered 2020-05-30: 0.1 mg via ORAL
  Filled 2020-05-30: qty 1

## 2020-05-30 MED ORDER — PROMETHAZINE HCL 25 MG PO TABS
25.0000 mg | ORAL_TABLET | Freq: Once | ORAL | Status: DC
  Filled 2020-05-30: qty 1

## 2020-05-30 MED ORDER — IBUPROFEN 400 MG PO TABS
400.0000 mg | ORAL_TABLET | Freq: Four times a day (QID) | ORAL | 0 refills | Status: DC | PRN
Start: 2020-05-30 — End: 2022-08-11

## 2020-05-30 MED ORDER — ACETAMINOPHEN 325 MG PO TABS
650.0000 mg | ORAL_TABLET | Freq: Once | ORAL | Status: DC
  Filled 2020-05-30: qty 2

## 2020-05-30 NOTE — Discharge Instructions (Addendum)
You are seen in the ER after you overdosed on Naprosyn. Fortunately it was not a life-threatening overdose. For your opioid withdrawal, take the medications that are prescribed and take it as prescribed.  You will feel rather terrible for the next 3 to 5 days, but the withdrawals are not life-threatening.  Substance Abuse Treatment Programs  Intensive Outpatient Programs [facility]     [address]      [place], [place]                   [phone]       [facility] [address] [place] [address] [place], [place] [phone]  [facility]     (Inpatient and outpatient)     [address]           [phone]    [facility] [phone] (Suboxone and Methadone)  [address]      [place], [place] [address]      [phone]       [address] [place], [place] [phone]  [facility] (Outpatient/Inpatient, [facility])    (insurance only) [phone]             [facility] (Groups & Residential) [place], [place] [phone]     [facility]     [address]     [place], [place]      [phone]       [facility] (for caregivers and family) [phone] [address] [place], [place] [phone]      Residential Treatment Programs [facility]      [address] [place], [place] [address]  [phone]       [facility] [address] [place], [place] [address] [phone]  [facility] [place]        [phone]       [facility] [phone]  [facility] ([facility]             [address]                                         [place], [place]                                                [phone] or [phone]                               [facility] [place] [address] [place], [address] [phone]  [facility]    [address]      [place],  [place]     [phone]       [facility] [address] [place], [place] [phone]  [facility]   [address] [place], [place] [address]     [phone]      Admissions: 8am-3pm M-F  [facility] ([facility]) [address] [place], [place] [phone]  [facility]: Residential Program [phone] Days)   [place], [place]      [phone] or [phone]     [facility]: [facility] [place], [place] (Walk  in Hours over the weekend or by referral)  [facility] [address] [place], [place], [place] [address] [phone]  Crisis Mobile: [facility]:  [phone] (for crisis response 24 hours a day) [facility] Hotline:      [phone] Outpatient Psychiatry and Counseling  [facility]: Mobile Crisis Management 24 hours:  [phone]  [facility] of the [employer] sliding scale fee and walk in schedule: M-F 8am-12pm/1pm-3pm [address]  [place], [place] [address] [phone]  [facility] [address] [place], [place] [address] [phone]  [facility] ([facility] [employer])- new patient walk-in appointments available Monday - Friday 8am -3pm.          [address] [place], [place] [address] [phone] or crisis line- [phone]  [facility] Outpatient Services/ Intensive Outpatient Therapy Program [address] [place], [place] [address] [phone]  [facility]                  Crisis Services      [phone] [address]     [place], [place] [address]                 [facility]   [facility] [phone]. [address] [place], [place] [address]   [employer] [facility]          [address]  [place], [place] [address]       [phone]  [facility] [address] [place], [place] [address] [phone]  [facility]    [address]    [place], [place] [address]     [phone]       Andee Poles, MD     [address]     [place] [address]     [phone]       [facility] [address] [place] [address]  [facility]     [address] [place], [place]      [phone]       [facility] Eulogio Ditch, MD [address] [place], [place] [address] [phone]  [facility]     [address]     [place], [place] [address]     [phone]       [facility] [address] [place], [place] [address] [phone] Resources for Temporary Residential Assistance/Crisis Centers  [facility]ENT[facility]) M-F 8am-3pm   [address] [place], [place] [address]   [phone] Services include: laundry, barbering, support groups, case management, phone  & computer access, showers, AA/NA mtgs, mental health/substance abuse nurse, job skills class, disability information, VA assistance, spiritual classes, etc.   HOMELESS SHELTERS  [facility]     [facility]   [address], [place]     [phone]              [employer] (women and children)       [address] [place], [place] [address] [phone] [email or web address] for application and process Application Required  [facility]   [address]    [place] [address]     [phone]  [place], [place] [address] [phone]([email or web address] application appt.) Application Required  [facility] (women only)    [address]     [place], [place] [address]     [phone]      Intake starts 6pm daily Need valid ID, SSC, & Police report [facility] [address] [place], [place] [phone] Application Required  [employer] (men only)     [place]      [place], [place]       [place] (Pregnant women only) [address] [place], [place]  [facility]      [address]      [place], [place] [address]     [phone]             [facility] [address] [place], [place] 90 day commitment/SA/Application process  [facility](men only)     [address]     [place], [place]       Check-in at [facility] [address] [place],  [address] [phone] Men/Women/Women and Children must be there by 7 pm  [place], [place]

## 2020-05-30 NOTE — ED Notes (Signed)
Patient took off cardiac monitoring

## 2020-05-30 NOTE — ED Provider Notes (Signed)
[facility]-EMERGENCY DEPT Provider Note   CSN: [id number] Arrival date & time: 05/30/20  1824     History Chief Complaint  Patient presents with  . Ingestion  . Drug Problem    Mark Ross is a 34 y.o. male.  HPI     34 year old male comes in a chief complaint of overdose and withdrawals. Patient has history of opiate abuse.  He reports that he took 6 Naprosyn's prior to ED arrival.  The fianc brought him into the ER because of his alleged overdose.  Patient essentially is having opiate withdrawal symptoms for the last 2 days, and the Naprosyn was taken to alleviate the symptoms.  He is having body aches, pain, restlessness and agitation.  He denies vomiting or diarrhea.  No past medical history on file.  There are no problems to display for this patient.   No past surgical history on file.     No family history on file.  Social History   Tobacco Use  . Smoking status: Current Every Day Smoker  . Smokeless tobacco: Never Used  Substance Use Topics  . Alcohol use: Yes  . Drug use: No    Home Medications Prior to Admission medications   Medication Sig Start Date End Date Taking? Authorizing Provider  naproxen (NAPROSYN) 250 MG tablet Take 250 mg by mouth once.   Yes [provider]  acetaminophen (TYLENOL 8 HOUR) 650 MG CR tablet Take 1 tablet (650 mg total) by mouth every 8 (eight) hours as needed. 05/30/20   Derwood Kaplan, MD  ibuprofen (ADVIL) 400 MG tablet Take 1 tablet (400 mg total) by mouth every 6 (six) hours as needed. 05/30/20   Derwood Kaplan, MD  ondansetron (ZOFRAN ODT) 4 MG disintegrating tablet Take 1 tablet (4 mg total) by mouth every 8 (eight) hours as needed for nausea. 05/30/20   Derwood Kaplan, MD    Allergies    Patient has no known allergies.  Review of Systems   Review of Systems  Constitutional: Positive for activity change.  Respiratory: Positive for shortness of breath.   Cardiovascular: Negative for  chest pain.  Gastrointestinal: Negative for nausea and vomiting.  Musculoskeletal: Positive for arthralgias and myalgias.  All other systems reviewed and are negative.   Physical Exam Updated Vital Signs BP (!) 111/59   Pulse 70   Temp 98.9 F (37.2 C) (Oral)   Resp 19   SpO2 97%   Physical Exam Vitals and nursing note reviewed.  Constitutional:      Appearance: He is well-developed.  HENT:     Head: Atraumatic.  Cardiovascular:     Rate and Rhythm: Normal rate.  Pulmonary:     Effort: Pulmonary effort is normal.  Musculoskeletal:     Cervical back: Neck supple.  Skin:    General: Skin is warm.  Neurological:     Mental Status: He is alert and oriented to person, place, and time.     ED Results / Procedures / Treatments   Labs (all labs ordered are listed, but only abnormal results are displayed) Labs Reviewed  CBC WITH DIFFERENTIAL/PLATELET - Abnormal; Notable for the following components:      Result Value   Neutro Abs 8.1 (*)    All other components within normal limits  SALICYLATE LEVEL - Abnormal; Notable for the following components:   Salicylate Lvl <7.0 (*)    All other components within normal limits  ACETAMINOPHEN LEVEL - Abnormal; Notable for the following components:  Acetaminophen (Tylenol), Serum <10 (*)    All other components within normal limits  COMPREHENSIVE METABOLIC PANEL  ETHANOL  RAPID URINE DRUG SCREEN, HOSP PERFORMED    EKG EKG Interpretation  Date/Time:  Sunday May 30 2020 18:37:33 EDT Ventricular Rate:  86 PR Interval:    QRS Duration: 91 QT Interval:  350 QTC Calculation: 419 R Axis:   78 Text Interpretation: Sinus rhythm ST elev, probable normal early repol pattern No acute changes No significant change since last tracing Confirmed by Tiawana Forgy ([id number]) on 05/30/2020 7:39:51 PM   Radiology No results found.  Procedures Procedures (including critical care time)  Medications Ordered in ED Medications    acetaminophen (TYLENOL) tablet 650 mg (has no administration in time range)  promethazine (PHENERGAN) tablet 25 mg (has no administration in time range)  sodium chloride 0.9 % bolus 1,000 mL (0 mLs Intravenous Stopped 05/30/20 2016)  acetaminophen (TYLENOL) tablet 650 mg (650 mg Oral Given 05/30/20 2011)  LORazepam (ATIVAN) tablet 2 mg (2 mg Oral Given 05/30/20 2012)  promethazine (PHENERGAN) tablet 25 mg (25 mg Oral Given 05/30/20 2012)  cloNIDine (CATAPRES) tablet 0.1 mg (0.1 mg Oral Given 05/30/20 2012)    ED Course  I have reviewed the triage vital signs and the nursing notes.  Pertinent labs & imaging results that were available during my care of the patient were reviewed by me and considered in my medical decision making (see chart for details).    MDM Rules/Calculators/A&P                          33 year old male comes in a chief complaint of Naprosyn overdose.  He only took 6 pills.  Labs ordered and they are reassuring.  He was given some IV fluid.  He is not complaining of abdominal pain, nausea, vomiting.  We will monitor him in the ED for short time.  Additionally we will give him medications for opiate withdrawal that he is suffering from.  Resources on outpatient care will also provided.  Final Clinical Impression(s) / ED Diagnoses Final diagnoses:  Opiate withdrawal (HCC)    Rx / DC Orders ED Discharge Orders         Ordered    acetaminophen (TYLENOL 8 HOUR) 650 MG CR tablet  Every 8 hours PRN     Discontinue  Reprint     05/30/20 2327    ibuprofen (ADVIL) 400 MG tablet  Every 6 hours PRN     Discontinue  Reprint     05/30/20 2327    ondansetron (ZOFRAN ODT) 4 MG disintegrating tablet  Every 8 hours PRN     Discontinue  Reprint     07 /04/21 2327           05-22-2005, MD 05/30/20 [phone]

## 2020-05-30 NOTE — ED Triage Notes (Addendum)
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 Upon counting, EMS reports patient could have taken up to to sixteen tablets. Patient reports ETOH today.  18g R AC  Patient denies SI. Reports last heroin use yesterday.

## 2020-05-30 NOTE — ED Notes (Signed)
ED Provider at bedside. 

## 2020-05-31 NOTE — ED Notes (Signed)
Patient stood up by his self and sat in the wheelchair without assistance.

## 2020-05-31 NOTE — ED Notes (Signed)
Patient uncooperative at time of discharge. Refusing to answer any questions and will not cooperate with staff members to get dressed and be taken outside to his ride.

## 2020-05-31 NOTE — ED Notes (Signed)
Call received from pt girlfriend Desaree Christell Constant [phone] requesting to be notified for pt d/c to p/u and take home. RN advised. [employer]

## 2022-08-11 ENCOUNTER — Emergency Department (HOSPITAL_COMMUNITY)
Admission: EM | Admit: 2022-08-11 | Discharge: 2022-08-11 | Disposition: A | Discharge status: Home / Self Care | Payer: Self-pay | Attending: Emergency Medicine | Admitting: Emergency Medicine

## 2022-08-11 ENCOUNTER — Emergency Department (HOSPITAL_COMMUNITY): Payer: Self-pay

## 2022-08-11 ENCOUNTER — Encounter (HOSPITAL_COMMUNITY): Payer: Self-pay

## 2022-08-11 DIAGNOSIS — M542 Cervicalgia: Secondary | ICD-10-CM | POA: Insufficient documentation

## 2022-08-11 DIAGNOSIS — J36 Peritonsillar abscess: Secondary | ICD-10-CM

## 2022-08-11 DIAGNOSIS — J020 Streptococcal pharyngitis: Secondary | ICD-10-CM

## 2022-08-11 LAB — URINALYSIS, ROUTINE W REFLEX MICROSCOPIC
Bacteria, UA: NONE SEEN
Bilirubin Urine: NEGATIVE
Glucose, UA: NEGATIVE mg/dL
Hgb urine dipstick: NEGATIVE
Ketones, ur: 20 mg/dL — AB
Leukocytes,Ua: NEGATIVE
Nitrite: NEGATIVE
Protein, ur: 30 mg/dL — AB
Specific Gravity, Urine: 1.015 (ref 1.005–1.030)
pH: 5 (ref 5.0–8.0)

## 2022-08-11 LAB — CBC
HCT: 39.3 % (ref 39.0–52.0)
Hemoglobin: 13.5 g/dL (ref 13.0–17.0)
MCH: 30.6 pg (ref 26.0–34.0)
MCHC: 34.4 g/dL (ref 30.0–36.0)
MCV: 89.1 fL (ref 80.0–100.0)
Platelets: 319 10*3/uL (ref 150–400)
RBC: 4.41 MIL/uL (ref 4.22–5.81)
RDW: 12.3 % (ref 11.5–15.5)
WBC: 18 10*3/uL — ABNORMAL HIGH (ref 4.0–10.5)
nRBC: 0 % (ref 0.0–0.2)

## 2022-08-11 LAB — GROUP A STREP BY PCR: Group A Strep by PCR: DETECTED — AB

## 2022-08-11 LAB — COMPREHENSIVE METABOLIC PANEL
ALT: 10 U/L (ref 0–44)
AST: 13 U/L — ABNORMAL LOW (ref 15–41)
Albumin: 3.8 g/dL (ref 3.5–5.0)
Alkaline Phosphatase: 105 U/L (ref 38–126)
Anion gap: 7 (ref 5–15)
BUN: 13 mg/dL (ref 6–20)
CO2: 29 mmol/L (ref 22–32)
Calcium: 9.1 mg/dL (ref 8.9–10.3)
Chloride: 104 mmol/L (ref 98–111)
Creatinine, Ser: 0.87 mg/dL (ref 0.61–1.24)
GFR, Estimated: >60 mL/min (ref >60)
Glucose, Bld: 111 mg/dL — ABNORMAL HIGH (ref 70–99)
Potassium: 3.4 mmol/L — ABNORMAL LOW (ref 3.5–5.1)
Sodium: 140 mmol/L (ref 135–145)
Total Bilirubin: 0.6 mg/dL (ref 0.3–1.2)
Total Protein: 7.5 g/dL (ref 6.5–8.1)

## 2022-08-11 LAB — ACETAMINOPHEN LEVEL
Acetaminophen (Tylenol), Serum: 28 ug/mL (ref 10–30)
Acetaminophen (Tylenol), Serum: <10 ug/mL — ABNORMAL LOW (ref 10–30)

## 2022-08-11 LAB — ETHANOL: Alcohol, Ethyl (B): <10 mg/dL (ref <10)

## 2022-08-11 LAB — RAPID URINE DRUG SCREEN, HOSP PERFORMED
Amphetamines: NOT DETECTED
Barbiturates: NOT DETECTED
Benzodiazepines: NOT DETECTED
Cocaine: NOT DETECTED
Opiates: POSITIVE — AB
Tetrahydrocannabinol: POSITIVE — AB

## 2022-08-11 MED ORDER — METRONIDAZOLE 500 MG/100ML IV SOLN
500.0000 mg | Freq: Two times a day (BID) | INTRAVENOUS | Status: DC
  Administered 2022-08-11: 500 mg via INTRAVENOUS
  Filled 2022-08-11 (×2): qty 100

## 2022-08-11 MED ORDER — SODIUM CHLORIDE 0.9 % IV SOLN
2.0000 g | Freq: Once | INTRAVENOUS | Status: AC
  Administered 2022-08-11: 2 g via INTRAVENOUS
  Filled 2022-08-11: qty 20

## 2022-08-11 MED ORDER — MORPHINE SULFATE (PF) 2 MG/ML IV SOLN
2.0000 mg | Freq: Once | INTRAVENOUS | Status: AC
  Administered 2022-08-11: 2 mg via INTRAVENOUS
  Filled 2022-08-11: qty 1

## 2022-08-11 MED ORDER — PROCHLORPERAZINE EDISYLATE 10 MG/2ML IJ SOLN
10.0000 mg | Freq: Once | INTRAMUSCULAR | Status: AC
  Administered 2022-08-11: 10 mg via INTRAVENOUS
  Filled 2022-08-11: qty 2

## 2022-08-11 MED ORDER — CLINDAMYCIN HCL 150 MG PO CAPS: 300.0000 mg | ORAL_CAPSULE | Freq: Three times a day (TID) | ORAL | 0 refills | Status: AC

## 2022-08-11 MED ORDER — SODIUM CHLORIDE 0.9 % IV BOLUS
1000.0000 mL | Freq: Once | INTRAVENOUS | Status: AC
  Administered 2022-08-11: 1000 mL via INTRAVENOUS

## 2022-08-11 MED ORDER — IOHEXOL 300 MG/ML  SOLN
75.0000 mL | Freq: Once | INTRAMUSCULAR | Status: AC | PRN
  Administered 2022-08-11: 100 mL via INTRAVENOUS

## 2022-08-11 MED ORDER — SODIUM CHLORIDE (PF) 0.9 % IJ SOLN
INTRAMUSCULAR | Status: AC
  Filled 2022-08-11: qty 50

## 2022-08-11 MED ORDER — IBUPROFEN 800 MG PO TABS: 800.0000 mg | ORAL_TABLET | Freq: Three times a day (TID) | ORAL | 0 refills | Status: AC

## 2022-08-11 MED ORDER — DEXAMETHASONE SODIUM PHOSPHATE 10 MG/ML IJ SOLN
10.0000 mg | Freq: Once | INTRAMUSCULAR | Status: AC
  Administered 2022-08-11: 10 mg via INTRAVENOUS
  Filled 2022-08-11: qty 1

## 2022-08-11 NOTE — ED Notes (Signed)
Pt is aware that a urine sample is needed.  

## 2022-08-11 NOTE — ED Provider Notes (Signed)
Physical Exam  BP 129/66 (BP Location: Left Arm)   Pulse 65   Temp 98.2 F (36.8 C) (Oral)   Resp 18   Ht 6' (1.829 m)   Wt 79.4 kg   SpO2 100%   BMI 23.73 kg/m   Physical Exam  Procedures  Procedures  ED Course / MDM   Clinical Course as of 08/11/22 1925  Fri Aug 11, 2022  1650 Upon reevaluation, patient is resting comfortably in bed. No pain or nausea at this time. Requesting to be sent home.  [AS]  1650 CT Soft Tissue Neck W Contrast I personally reviewed the image. No well defined fluid collection [AS]  1802 This is a 36 year old male presented to ED with a sore throat, ongoing for 2 to 3 days, found subsequently to have had a peritonsillar infection, possible small abscess, which spontaneously began draining here in the ED.  He reports his pain is significantly improved since he arrived.  There is no visible drainable abscess retained on CT imaging per my review.  He does have a leukocytosis but he is afebrile without any other signs of sepsis.  He is also positive for group A strep.  On exam he appears comfortable, has no visible peritonsillar abscess, no active drainage at this time.  He is hungry and thirsty and able to tolerate p.o.  He does unfortunately report taking higher than normal doses of Tylenol due to pain at home, up to 6000 mg yesterday and today, including 2 hours prior to arrival.  His initial Tylenol level and LFTs are within normal limits.  We are awaiting a repeat Tylenol level.  He has received Rocephin and Flagyl here.  He will be started on clindamycin for the next 9 days if he is discharged home.  At this time there is no evidence of sepsis, tracking deep space infection, surgical infection, or airway compromise.  He was also given Decadron in the ED. [MT]    Clinical Course User Index [AS] Mark Ross, Mark Petrin, PA-C [MT] Mark Rakers Kermit Balo, MD   Medical Decision Making Amount and/or Complexity of Data Reviewed Labs: ordered. Radiology:  ordered.  Risk Prescription drug management.  Care taken over from [place], [place]. In short, patient was BIBA for tylenol overdose. He took 6000 mg yesterday and another 6000 mg today.  She took this higher dose because he has been feeling sore throat 2 days and it was contributing to pain.  Denies suicidal or homicidal ideations.  Patient is awaiting CT neck and labs.  CT showed inflammation with no obvious abscess.  Acetaminophen trended down on repeat.  Patient is able to tolerate p.o. food and liquids.  Will be given clindamycin 300 mg 3 times daily for 9 days for strep throat and peritonsillar abscess.  Abscess likely ruptured during abscess patient reported purulent drainage from the back of his throat during episodes of emesis.  On reevaluation patient reported no pain or nausea.  Patient has been afebrile during his stay.  Stable for discharge.  At this time there does not appear to be any evidence of an acute emergency medical condition and the patient appears stable for discharge with appropriate outpatient follow up. Diagnosis was discussed with patient who verbalizes understanding of care plan and is agreeable to discharge. I have discussed return precautions with patient who verbalizes understanding. Patient encouraged to follow-up with their PCP within 1 week. All questions answered.  Patient's case discussed with Dr. Renaye Rakers who agrees with plan to discharge with follow-up.  Note: Portions of this report may have been transcribed using voice recognition software. Every effort was made to ensure accuracy; however, inadvertent computerized transcription errors may still be present.        Mark Ross, Mark Ross 08/11/22 1952    Mark Lefevre, MD 08/12/22 1429

## 2022-08-11 NOTE — ED Provider Notes (Signed)
[facility]-EMERGENCY DEPT Provider Note   CSN: [id number] Arrival date & time: 08/11/22  1344     History  Chief Complaint  Patient presents with   tylenol od    Mark Ross is a 36 y.o. male with noncontributory past medical history who presents with concern for severe sore throat for the last few days, with nausea, difficulty swallowing Presents to the emergency department with report of "Tylenol overdose".  Patient denies any history of depression, anxiety, suicidal ideation, homicidal ideation, AVH.  Patient reports that he "just did not know he was not supposed to take that much".  Patient reports that he had been taking 4 500 mg Tylenol every time the pain would return.  He was worried that he may have something like strep throat but wanted to be able to get through work.  No previous history of severe strep throat, IV drug use, previous suicide attempt.  No previous allergies, he does not take an ACE inhibitor, no familial history of angioedema.  HPI     Home Medications Prior to Admission medications   Medication Sig Start Date End Date Taking? Authorizing Provider  acetaminophen (TYLENOL 8 HOUR) 650 MG CR tablet Take 1 tablet (650 mg total) by mouth every 8 (eight) hours as needed. 05/30/20   Derwood Kaplan, MD  ibuprofen (ADVIL) 400 MG tablet Take 1 tablet (400 mg total) by mouth every 6 (six) hours as needed. 05/30/20   Derwood Kaplan, MD  naproxen (NAPROSYN) 250 MG tablet Take 250 mg by mouth once.    [provider]  ondansetron (ZOFRAN ODT) 4 MG disintegrating tablet Take 1 tablet (4 mg total) by mouth every 8 (eight) hours as needed for nausea. 05/30/20   Derwood Kaplan, MD      Allergies    Patient has no known allergies.    Review of Systems   Review of Systems  All other systems reviewed and are negative.   Physical Exam Updated Vital Signs BP 129/66 (BP Location: Left Arm)   Pulse 65   Temp 98.2 F (36.8 C) (Oral)   Resp  18   Ht 6' (1.829 m)   Wt 79.4 kg   SpO2 100%   BMI 23.73 kg/m  Physical Exam Vitals and nursing note reviewed.  Constitutional:      General: He is not in acute distress.    Appearance: Normal appearance.  HENT:     Head: Normocephalic and atraumatic.     Mouth/Throat:     Comments: Posterior oropharynx is significantly obscured due to trismus, and large swollen likely PTA on the left.  No active drainage noted. Uvula difficult to visualize. Cannot visualize his lower oropharynx 2/2 swelling. Eyes:     General:        Right eye: No discharge.        Left eye: No discharge.  Neck:     Comments: Patient with firmness of the neck, without stridor, and is able to swallow Cardiovascular:     Rate and Rhythm: Normal rate and regular rhythm.  Pulmonary:     Effort: Pulmonary effort is normal. No respiratory distress.  Musculoskeletal:        General: No deformity.     Cervical back: Tenderness present.  Lymphadenopathy:     Cervical: Cervical adenopathy present.  Skin:    General: Skin is warm and dry.  Neurological:     Mental Status: He is alert and oriented to person, place, and time.  Psychiatric:  Mood and Affect: Mood normal.        Behavior: Behavior normal.     ED Results / Procedures / Treatments   Labs (all labs ordered are listed, but only abnormal results are displayed) Labs Reviewed  GROUP A STREP BY PCR - Abnormal; Notable for the following components:      Result Value   Group A Strep by PCR DETECTED (*)    All other components within normal limits  ACETAMINOPHEN LEVEL  CBC  ETHANOL  URINALYSIS, ROUTINE W REFLEX MICROSCOPIC  RAPID URINE DRUG SCREEN, HOSP PERFORMED  COMPREHENSIVE METABOLIC PANEL    EKG EKG Interpretation  Date/Time:  Friday August 11 2022 14:16:50 EDT Ventricular Rate:  65 PR Interval:  140 QRS Duration: 95 QT Interval:  395 QTC Calculation: 411 R Axis:   81 Text Interpretation: Sinus arrhythmia No significant change  since prior 7/21 Confirmed by Meridee Score [phone]) on 08/11/2022 2:18:06 PM  Radiology DG Chest 2 View  Result Date: 08/11/2022 CLINICAL DATA:  Possible aspiration EXAM: CHEST - 2 VIEW COMPARISON:  None Available. FINDINGS: The heart size and mediastinal contours are within normal limits. Both lungs are clear. The visualized skeletal structures are unremarkable. IMPRESSION: No active cardiopulmonary disease. Electronically Signed   By: Lorenza Cambridge M.D.   On: 08/11/2022 14:48    Procedures Procedures    Medications Ordered in ED Medications  cefTRIAXone (ROCEPHIN) 2 g in sodium chloride 0.9 % 100 mL IVPB (2 g Intravenous New Bag/Given 08/11/22 1503)  metroNIDAZOLE (FLAGYL) IVPB 500 mg (has no administration in time range)  sodium chloride 0.9 % bolus 1,000 mL (1,000 mLs Intravenous New Bag/Given 08/11/22 1503)  dexamethasone (DECADRON) injection 10 mg (10 mg Intravenous Given 08/11/22 1458)  morphine (PF) 2 MG/ML injection 2 mg (2 mg Intravenous Given 08/11/22 1454)  prochlorperazine (COMPAZINE) injection 10 mg (10 mg Intravenous Given 08/11/22 1457)    ED Course/ Medical Decision Making/ A&P                            Medical Decision Making Amount and/or Complexity of Data Reviewed Labs: ordered. Radiology: ordered.  Risk Prescription drug management.   This is a 36 year old male who presents for 2 distinct problems.  Patient initially presents for Tylenol overdose reporting that he is taken approximately 6000 mg of Tylenol in the last 24 hours secondary to significant pain in the throat.  Patient reports that it felt worse than strep throat.  On my exam patient with clinical signs and symptoms of large peritonsillar abscess on the left.  He has trismus, difficulty speaking, but is able to tolerate his own secretions at this time.  He is afebrile, otherwise nontoxic, nonseptic appearing.  He denies any SI, HI, AVH.  He has no previous history of suicidal ideations, suicide attempts,  and thought content is appropriate at this time, patient is insightful about condition.  This provider was notified by paramedic working in fast track that patient was having sudden vomiting of blood and pus.  On evaluation patient is intermittently vomiting but able to speak.  He reports that he does not think that he aspirated any vomit.  He reports that he may have swallowed some pus and blood.  There is a foul smell noted in the room.  I see evidence of abscess contents in his emesis bag.  Suspicious that PTA was ruptured during PCR strep collection  We will obtain lab work, CT of the  neck with contrast to evaluate for fluid collection, as well as plain film chest x-ray to ensure the patient did not aspirate during presumed PTA rupture during swab collection.  We will administer pain medication, fluids, Decadron, Rocephin, Flagyl, and Compazine for nausea.  3:09 PM Care of Stephannie Peters transferred to [facility] and Dr. Particia Nearing at the end of my shift as the patient will require reassessment once labs/imaging have resulted. Patient presentation, ED course, and plan of care discussed with review of all pertinent labs and imaging. Please see his/her note for further details regarding further ED course and disposition. Plan at time of handoff is pending labwork, tylenol level, CT soft tissue neck, consult ENT, re-evaluate mental status. This may be altered or completely changed at the discretion of the oncoming team pending results of further workup.  Final Clinical Impression(s) / ED Diagnoses Final diagnoses:  None    Rx / DC Orders ED Discharge Orders     None         [place] 08/11/22 1509    Terrilee Files, MD 08/11/22 1906

## 2022-08-11 NOTE — ED Triage Notes (Signed)
Per EMs- Patient c/o sore throat and took approx 6000 mg Tylenol yesterday and today for flu like symptoms. Patient states he was just trying to get through work. EMS reports that the patient vomited upon their arrival and had taken the last dose of Tylenol approx 2 hours earlier  Patient received zofran 4 mg IV and NS 500 ml prior to arrival to the ED.

## 2022-08-11 NOTE — Discharge Instructions (Signed)
You have been seen today for your complaint of sore throat. Your lab work showed you were positive for strep throat. All other labs were reassuring. Your imaging showed small collection of fluid around your tonsils. Was otherwise reassuring. Your discharge medications include ciprofloxacin. This is an antibiotic. This may cause an upset stomach. You may take this medication with food. You should take it for the entire duration. Home care instructions are as follows:  You may eat and drink like normal. You should monitor for fevers. You should strictly follow tylenol dosing instructions on the bottle. Follow up with:your primary care provider in one week. Please seek immediate medical care if you develop any of the following symptoms: You have new symptoms, such as vomiting, severe headache, stiff or painful neck, chest pain, or shortness of breath. You have severe throat pain, drooling, or changes in your voice. You have swelling of the neck, or the skin on the neck becomes red and tender. You have signs of dehydration, such as tiredness (fatigue), dry mouth, and decreased urination. You become increasingly sleepy, or you cannot wake up completely. Your joints become red or painful. At this time there does not appear to be the presence of an emergent medical condition, however there is always the potential for conditions to change. Please read and follow the below instructions.  Do not take your medicine if  develop an itchy rash, swelling in your mouth or lips, or difficulty breathing; call 911 and seek immediate emergency medical attention if this occurs.  You may review your lab tests and imaging results in their entirety on your MyChart account.  Please discuss all results of fully with your primary care provider and other specialist at your follow-up visit.  Note: Portions of this text may have been transcribed using voice recognition software. Every effort was made to ensure accuracy;  however, inadvertent computerized transcription errors may still be present.

## 2024-02-23 ENCOUNTER — Emergency Department (HOSPITAL_COMMUNITY)
Admission: EM | Admit: 2024-02-23 | Discharge: 2024-02-23 | Disposition: A | Discharge status: Home / Self Care | Payer: MEDICAID | Attending: Emergency Medicine | Admitting: Emergency Medicine

## 2024-02-23 ENCOUNTER — Encounter (HOSPITAL_COMMUNITY): Payer: Self-pay | Admitting: Emergency Medicine

## 2024-02-23 ENCOUNTER — Other Ambulatory Visit: Payer: Self-pay

## 2024-02-23 DIAGNOSIS — Z23 Encounter for immunization: Secondary | ICD-10-CM | POA: Diagnosis not present

## 2024-02-23 DIAGNOSIS — W228XXA Striking against or struck by other objects, initial encounter: Secondary | ICD-10-CM | POA: Insufficient documentation

## 2024-02-23 DIAGNOSIS — S61011A Laceration without foreign body of right thumb without damage to nail, initial encounter: Secondary | ICD-10-CM

## 2024-02-23 DIAGNOSIS — S6991XA Unspecified injury of right wrist, hand and finger(s), initial encounter: Secondary | ICD-10-CM | POA: Diagnosis present

## 2024-02-23 MED ORDER — TETANUS-DIPHTH-ACELL PERTUSSIS 5-2.5-18.5 LF-MCG/0.5 IM SUSY
0.5000 mL | PREFILLED_SYRINGE | Freq: Once | INTRAMUSCULAR | Status: AC
  Administered 2024-02-23: 0.5 mL via INTRAMUSCULAR
  Filled 2024-02-23: qty 0.5

## 2024-02-23 MED ORDER — LIDOCAINE HCL (PF) 1 % IJ SOLN
5.0000 mL | Freq: Once | INTRAMUSCULAR | Status: AC
  Administered 2024-02-23: 5 mL
  Filled 2024-02-23: qty 5

## 2024-02-23 MED ORDER — LIDOCAINE-EPINEPHRINE-TETRACAINE (LET) TOPICAL GEL
3.0000 mL | Freq: Once | TOPICAL | Status: AC
  Administered 2024-02-23: 3 mL via TOPICAL
  Filled 2024-02-23: qty 3

## 2024-02-23 NOTE — ED Triage Notes (Signed)
 Brought in police custody with wound to right hand after punching TV approx. 30 minutes ago. Unsure last tetanus.

## 2024-02-23 NOTE — ED Notes (Signed)
 Patient discharged home. Patient ambulatory out of treatment area with clean and steady gait. All questions answered at time of discharge. Belongings sent home with patient.

## 2024-02-23 NOTE — Discharge Instructions (Signed)
 Please be sure to keep your wound clean.  Watch for signs of developing infection.  If you see signs of developing infection please seek reevaluation.  You should be seen by a medical provider in 7 to 10 days for suture removal.

## 2024-02-23 NOTE — ED Provider Notes (Signed)
 [place] EMERGENCY DEPARTMENT AT [facility] Provider Note   CSN: [id number] Arrival date & time: 02/23/24  8295     History  Chief Complaint  Patient presents with   Laceration    Mark Ross is a 38 y.o. male.  Patient presents to the emergency department in police custody with a complaint of a laceration to the right thumb.  Patient reportedly punched a TV 30 minutes prior to arrival.  He does not remember the last time he had a tetanus shot.  No relevant past medical history on file   Laceration      Home Medications Prior to Admission medications   Medication Sig Start Date End Date Taking? Authorizing Provider  acetaminophen (TYLENOL 8 HOUR) 650 MG CR tablet Take 1 tablet (650 mg total) by mouth every 8 (eight) hours as needed. Patient not taking: Reported on 08/11/2022 05/30/20   Derwood Kaplan, MD  acetaminophen (TYLENOL) 500 MG tablet Take 2,000 mg by mouth every 2 (two) hours as needed (for throat pain).    [provider]  ibuprofen (ADVIL) 800 MG tablet Take 1 tablet (800 mg total) by mouth 3 (three) times daily. 08/11/22   Schutt, Edsel Petrin, PA-C  ondansetron (ZOFRAN ODT) 4 MG disintegrating tablet Take 1 tablet (4 mg total) by mouth every 8 (eight) hours as needed for nausea. Patient not taking: Reported on 08/11/2022 05/30/20   Derwood Kaplan, MD      Allergies    Patient has no known allergies.    Review of Systems   Review of Systems  Physical Exam Updated Vital Signs BP 125/77 (BP Location: Left Arm)   Pulse 86   Temp 98 F (36.7 C) (Oral)   Resp 20   SpO2 99%  Physical Exam Vitals and nursing note reviewed.  HENT:     Head: Normocephalic and atraumatic.  Eyes:     Conjunctiva/sclera: Conjunctivae normal.  Cardiovascular:     Rate and Rhythm: Normal rate.  Pulmonary:     Effort: Pulmonary effort is normal. No respiratory distress.  Musculoskeletal:        General: Signs of injury present.     Cervical back: Normal  range of motion.     Comments: Patient with normal range of motion of the right thumb, sensation intact, brisk cap refill.  Approximately 3 cm laceration noted to dorsal portion of right thumb.  Skin:    General: Skin is dry.  Neurological:     Mental Status: He is alert.  Psychiatric:        Speech: Speech normal.        Behavior: Behavior normal.     ED Results / Procedures / Treatments   Labs (all labs ordered are listed, but only abnormal results are displayed) Labs Reviewed - No data to display  EKG None  Radiology No results found.  Procedures .Laceration Repair  Date/Time: 02/23/2024 5:51 AM  Performed by: Darrick Grinder, PA-C Authorized by: Darrick Grinder, PA-C   Consent:    Consent obtained:  Verbal   Consent given by:  Patient   Risks, benefits, and alternatives were discussed: yes     Risks discussed:  Need for additional repair, infection, pain, poor cosmetic result, poor wound healing and nerve damage Anesthesia:    Anesthesia method:  Topical application   Topical anesthetic:  LET Laceration details:    Location:  Finger   Finger location:  R thumb   Length (cm):  3 Pre-procedure details:  Preparation:  Patient was prepped and draped in usual sterile fashion Exploration:    Hemostasis achieved with:  LET Treatment:    Area cleansed with:  Saline   Amount of cleaning:  Standard Skin repair:    Repair method:  Sutures   Suture size:  5-0   Suture material:  Prolene   Suture technique:  Simple interrupted   Number of sutures:  3 Approximation:    Approximation:  Loose Repair type:    Repair type:  Simple Post-procedure details:    Dressing:  Sterile dressing   Procedure completion:  Tolerated well, no immediate complications     Medications Ordered in ED Medications  Tdap (BOOSTRIX) injection 0.5 mL (0.5 mLs Intramuscular Given 02/23/24 0454)  lidocaine-EPINEPHrine-tetracaine (LET) topical gel (3 mLs Topical Given 02/23/24 0455)   lidocaine (PF) (XYLOCAINE) 1 % injection 5 mL (5 mLs Infiltration Given by Other 02/23/24 0455)    ED Course/ Medical Decision Making/ A&P                                 Medical Decision Making Risk Prescription drug management.   This patient presents to the ED for concern of a thumb injury, this involves an extensive number of treatment options, and is a complaint that carries with it a high risk of complications and morbidity.  The differential diagnosis includes soft tissue injury, fracture, dislocation   Co morbidities that complicate the patient evaluation  None   Additional history obtained:  Additional history obtained from law enforcement  Problem List / ED Course / Critical interventions / Medication management   I ordered medication including let for pain control/anesthesia Reevaluation of the patient after these medicines showed that the patient improved I have reviewed the patients home medicines and have made adjustments as needed   Social Determinants of Health:  Patient is a daily tobacco smoker   Test / Admission - Considered:  Patient with laceration to dorsal portion of right thumb.  Neurovascularly intact.  Laceration repaired with 3 simple interrupted sutures.  Patient declined further sutures.  Tdap booster was provided.  No indication at this time for antibiotics.  Plan to discharge with recommendations for suture removal in 7 to 10 days.  Return precautions provided.         Final Clinical Impression(s) / ED Diagnoses Final diagnoses:  Laceration of right thumb without foreign body without damage to nail, initial encounter    Rx / DC Orders ED Discharge Orders     None         Pamala Duffel 02/23/24 0557    Nira Conn, MD 02/23/24 [phone]
